# Patient Record
Sex: Female | Born: 1995 | Hispanic: Yes | Marital: Single | Smoking: Never smoker
Health system: Southern US, Community
[De-identification: ages and names within clinical notes are randomized; demographics above are authoritative.]

---

## 2020-12-03 ENCOUNTER — Emergency Department
Admission: EM | Admit: 2020-12-03 | Discharge: 2020-12-03 | Disposition: A | Discharge status: Home / Self Care | Payer: Self-pay | Attending: Emergency Medicine | Admitting: Emergency Medicine

## 2020-12-03 ENCOUNTER — Other Ambulatory Visit: Payer: Self-pay

## 2020-12-03 ENCOUNTER — Encounter: Payer: Self-pay | Admitting: Emergency Medicine

## 2020-12-03 ENCOUNTER — Emergency Department: Payer: Self-pay

## 2020-12-03 DIAGNOSIS — N83202 Unspecified ovarian cyst, left side: Secondary | ICD-10-CM

## 2020-12-03 DIAGNOSIS — N83292 Other ovarian cyst, left side: Secondary | ICD-10-CM | POA: Insufficient documentation

## 2020-12-03 DIAGNOSIS — N939 Abnormal uterine and vaginal bleeding, unspecified: Secondary | ICD-10-CM

## 2020-12-03 DIAGNOSIS — R300 Dysuria: Secondary | ICD-10-CM | POA: Insufficient documentation

## 2020-12-03 LAB — CBC
HCT: 37.6 % (ref 36.0–46.0)
Hemoglobin: 12.8 g/dL (ref 12.0–15.0)
MCH: 29.6 pg (ref 26.0–34.0)
MCHC: 34 g/dL (ref 30.0–36.0)
MCV: 87 fL (ref 80.0–100.0)
Platelets: 352 10*3/uL (ref 150–400)
RBC: 4.32 MIL/uL (ref 3.87–5.11)
RDW: 12.4 % (ref 11.5–15.5)
WBC: 7.1 10*3/uL (ref 4.0–10.5)
nRBC: 0 % (ref 0.0–0.2)

## 2020-12-03 LAB — POC URINE PREG, ED: Preg Test, Ur: NEGATIVE

## 2020-12-03 LAB — URINALYSIS, COMPLETE (UACMP) WITH MICROSCOPIC
Bacteria, UA: NONE SEEN
Bilirubin Urine: NEGATIVE
Glucose, UA: NEGATIVE mg/dL
Ketones, ur: NEGATIVE mg/dL
Leukocytes,Ua: NEGATIVE
Nitrite: NEGATIVE
Protein, ur: NEGATIVE mg/dL
Specific Gravity, Urine: 1.004 — ABNORMAL LOW (ref 1.005–1.030)
pH: 6 (ref 5.0–8.0)

## 2020-12-03 LAB — ABO/RH: ABO/RH(D): O POS

## 2020-12-03 LAB — HCG, QUANTITATIVE, PREGNANCY: hCG, Beta Chain, Quant, S: <1 m[IU]/mL (ref <5)

## 2020-12-03 MED ORDER — IBUPROFEN 600 MG PO TABS
600.0000 mg | ORAL_TABLET | Freq: Once | ORAL | Status: AC
  Administered 2020-12-03: 600 mg via ORAL
  Filled 2020-12-03: qty 1

## 2020-12-03 NOTE — ED Notes (Signed)
Back from ultrasound

## 2020-12-03 NOTE — ED Notes (Signed)
In person medical interpreter Orson Slick present at bedside along with Dr Marisa Severin. Discussed discharge education, pain management, follow up care with patient who verbalizes understanding.

## 2020-12-03 NOTE — ED Provider Notes (Signed)
[facility] Emergency Department Provider Note ____________________________________________   Event Date/Time   First MD Initiated Contact with Patient 12/03/20 1049     (approximate)  I have reviewed the triage vital signs and the nursing notes.   HISTORY  Chief Complaint Vaginal Bleeding  HPI and ROS obtained via in-person Spanish interpreter  HPI Tanya Freeman is a 25 y.o. female with LMP of 1/24 who presents with bilateral flank and lower abdominal pain since yesterday associated with a small amount of vaginal bleeding.  The patient also reports dysuria but no vaginal discharge.   She denies any vomiting or diarrhea, fever or chills, weakness, lightheadedness, or other acute symptoms.  She has not taken anything for the pain at home.  The patient has not yet sought prenatal care.  She had a positive pregnancy test at home a few weeks ago.   History reviewed. No pertinent past medical history.  There are no problems to display for this patient.   History reviewed. No pertinent surgical history.  Prior to Admission medications   Not on File    Allergies Patient has no known allergies.  No family history on file.  Social History Social History   Tobacco Use  . Smoking status: Never Smoker  . Smokeless tobacco: Never Used  Substance Use Topics  . Alcohol use: Never    Review of Systems  Constitutional: No fever/chills Eyes: No visual changes. ENT: No sore throat. Cardiovascular: Denies chest pain. Respiratory: Denies shortness of breath. Gastrointestinal: No vomiting or diarrhea.  Genitourinary: Positive for dysuria.  Musculoskeletal: Negative for back pain. Skin: Negative for rash. Neurological: Negative for headaches, focal weakness or numbness.   ____________________________________________   PHYSICAL EXAM:  VITAL SIGNS: ED Triage Vitals [12/03/20 0839]  Enc Vitals Group     BP (!) 131/93     Pulse Rate 71     Resp 20      Temp 99.5 F (37.5 C)     Temp Source Oral     SpO2 98 %     Weight 173 lb (78.5 kg)     Height 5' 6.14" (1.68 m)     Head Circumference      Peak Flow      Pain Score 8     Pain Loc      Pain Edu?      Excl. in GC?     Constitutional: Alert and oriented. Well appearing and in no acute distress. Eyes: Conjunctivae are normal.  Head: Atraumatic. Nose: No congestion/rhinnorhea. Mouth/Throat: Mucous membranes are moist.   Neck: Normal range of motion.  Cardiovascular: Normal rate, regular rhythm. Good peripheral circulation. Respiratory: Normal respiratory effort.  No retractions. Gastrointestinal: Soft with mild bilateral lower quadrant discomfort.  No focal tenderness or peritoneal signs. No distention.  Genitourinary: Normal external genitalia.  Tiny amount of blood from the cervix, no active hemorrhage.  No pooling in the vaginal vault.  No significant tenderness.  No discharge. Musculoskeletal: No lower extremity edema.  Extremities warm and well perfused.  Neurologic:  Normal speech and language. No gross focal neurologic deficits are appreciated.  Skin:  Skin is warm and dry. No rash noted. Psychiatric: Mood and affect are normal. Speech and behavior are normal.  ____________________________________________   LABS (all labs ordered are listed, but only abnormal results are displayed)  Labs Reviewed  URINALYSIS, COMPLETE (UACMP) WITH MICROSCOPIC - Abnormal; Notable for the following components:      Result Value   Color, Urine  COLORLESS (*)    APPearance CLEAR (*)    Specific Gravity, Urine 1.004 (*)    Hgb urine dipstick MODERATE (*)    All other components within normal limits  HCG, QUANTITATIVE, PREGNANCY  CBC  POC URINE PREG, ED  ABO/RH   ____________________________________________  EKG   ____________________________________________  RADIOLOGY  US pelvis: 3 cm simple left ovarian  cyst.  ____________________________________________   PROCEDURES  Procedure(s) performed: No  Procedures  Critical Care performed: No ____________________________________________   INITIAL IMPRESSION / ASSESSMENT AND PLAN / ED COURSE  Pertinent labs & imaging results that were available during my care of the patient were reviewed by me and considered in my medical decision making (see chart for details).  25 year old female with LMP of 1/24 and a positive pregnancy test at home 2 weeks ago presents with bilateral lower abdominal/pelvic pain along with a small amount of vaginal bleeding since yesterday.   On exam, the patient is overall well-appearing.  Her vital signs are normal.  The abdomen is soft with mild bilateral lower quadrant discomfort but no focal tenderness or peritoneal signs.  Pelvic exam reveals a trace amount of blood but no discharge or significant tenderness.  The initial presumption based on the patient's history was that the patient was pregnant, however her urine pregnancy test is negative.  Differential includes ovarian cyst, uterine fibroids, endometriosis, completed spontaneous AB, dysfunctional uterine bleeding, or dysmenorrhea.  There is no evidence ovarian torsion given the patient's well appearance and relatively mild level of discomfort and her reassuring exam.  There is also no evidence of PID given the lack of discharge or cervical tenderness.  We will obtain a urinalysis, ultrasound, and reassess.  ----------------------------------------- 1:28 PM on 12/03/2020 -----------------------------------------  Ultrasound shows a 3 cm left ovarian cyst.  There are no other acute abnormalities.  The urinalysis is negative as well.  On reassessment the patient states that the pain is subsiding and she has no tenderness on abdominal exam.  She has not required any pain medication in the ED although I will give her ibuprofen now.  There is no clinical evidence of  ovarian torsion.  There is no evidence of ureteral stone or other nongynecologic etiology.  At this time, the patient is stable for discharge home.  I counseled her on the results of the work-up.  She plans to follow-up at the [facility].  Return precautions given, and she expressed understanding.  Results, discharge instructions, and return precautions were discussed with the patient via an in-person Spanish interpreter.  ____________________________________________   FINAL CLINICAL IMPRESSION(S) / ED DIAGNOSES  Final diagnoses:  Cyst of left ovary      NEW MEDICATIONS STARTED DURING THIS VISIT:  New Prescriptions   No medications on file     Note:  This document was prepared using Dragon voice recognition software and may include unintentional dictation errors.   Dionne Bucy, MD 12/03/20 1330

## 2020-12-03 NOTE — ED Triage Notes (Addendum)
Patient to ER for c/o vaginal bleeding. LMP was 08/27/20, giving patient approx [redacted] weeks gestation. Patient noticed one blood clot last night, having spotting this am. Patient has not received any prenatal care at this point. States approx every 10 mins, she has pain to left lower abdomen and into left flank.

## 2020-12-03 NOTE — ED Notes (Signed)
To ultrasound via wheelchiar with ultrasound staff.

## 2020-12-22 ENCOUNTER — Other Ambulatory Visit: Payer: Self-pay

## 2020-12-22 ENCOUNTER — Encounter: Payer: Self-pay | Admitting: Emergency Medicine

## 2020-12-22 ENCOUNTER — Emergency Department
Admission: EM | Admit: 2020-12-22 | Discharge: 2020-12-22 | Disposition: A | Discharge status: Home / Self Care | Payer: Self-pay | Attending: Emergency Medicine | Admitting: Emergency Medicine

## 2020-12-22 DIAGNOSIS — N83202 Unspecified ovarian cyst, left side: Secondary | ICD-10-CM | POA: Insufficient documentation

## 2020-12-22 DIAGNOSIS — N939 Abnormal uterine and vaginal bleeding, unspecified: Secondary | ICD-10-CM | POA: Insufficient documentation

## 2020-12-22 LAB — COMPREHENSIVE METABOLIC PANEL
ALT: 25 U/L (ref 0–44)
AST: 21 U/L (ref 15–41)
Albumin: 4 g/dL (ref 3.5–5.0)
Alkaline Phosphatase: 74 U/L (ref 38–126)
Anion gap: 6 (ref 5–15)
BUN: 10 mg/dL (ref 6–20)
CO2: 23 mmol/L (ref 22–32)
Calcium: 8.8 mg/dL — ABNORMAL LOW (ref 8.9–10.3)
Chloride: 109 mmol/L (ref 98–111)
Creatinine, Ser: 0.58 mg/dL (ref 0.44–1.00)
GFR, Estimated: >60 mL/min (ref >60)
Glucose, Bld: 99 mg/dL (ref 70–99)
Potassium: 4 mmol/L (ref 3.5–5.1)
Sodium: 138 mmol/L (ref 135–145)
Total Bilirubin: 0.3 mg/dL (ref 0.3–1.2)
Total Protein: 7.5 g/dL (ref 6.5–8.1)

## 2020-12-22 LAB — URINALYSIS, COMPLETE (UACMP) WITH MICROSCOPIC
Bilirubin Urine: NEGATIVE
Glucose, UA: NEGATIVE mg/dL
Ketones, ur: NEGATIVE mg/dL
Leukocytes,Ua: NEGATIVE
Nitrite: NEGATIVE
Protein, ur: 100 mg/dL — AB
RBC / HPF: >50 RBC/hpf — ABNORMAL HIGH (ref 0–5)
Specific Gravity, Urine: 1.018 (ref 1.005–1.030)
pH: 6 (ref 5.0–8.0)

## 2020-12-22 LAB — CBC WITH DIFFERENTIAL/PLATELET
Abs Immature Granulocytes: 0.04 10*3/uL (ref 0.00–0.07)
Basophils Absolute: 0.1 10*3/uL (ref 0.0–0.1)
Basophils Relative: 1 %
Eosinophils Absolute: 0.1 10*3/uL (ref 0.0–0.5)
Eosinophils Relative: 1 %
HCT: 36.4 % (ref 36.0–46.0)
Hemoglobin: 12.4 g/dL (ref 12.0–15.0)
Immature Granulocytes: 0 %
Lymphocytes Relative: 20 %
Lymphs Abs: 2.1 10*3/uL (ref 0.7–4.0)
MCH: 29.7 pg (ref 26.0–34.0)
MCHC: 34.1 g/dL (ref 30.0–36.0)
MCV: 87.3 fL (ref 80.0–100.0)
Monocytes Absolute: 0.6 10*3/uL (ref 0.1–1.0)
Monocytes Relative: 5 %
Neutro Abs: 7.9 10*3/uL — ABNORMAL HIGH (ref 1.7–7.7)
Neutrophils Relative %: 73 %
Platelets: 361 10*3/uL (ref 150–400)
RBC: 4.17 MIL/uL (ref 3.87–5.11)
RDW: 12.6 % (ref 11.5–15.5)
WBC: 10.7 10*3/uL — ABNORMAL HIGH (ref 4.0–10.5)
nRBC: 0 % (ref 0.0–0.2)

## 2020-12-22 LAB — POC URINE PREG, ED: Preg Test, Ur: NEGATIVE

## 2020-12-22 MED ORDER — KETOROLAC TROMETHAMINE 30 MG/ML IJ SOLN
30.0000 mg | Freq: Once | INTRAMUSCULAR | Status: AC
  Administered 2020-12-22: 30 mg via INTRAMUSCULAR
  Filled 2020-12-22: qty 1

## 2020-12-22 MED ORDER — ACETAMINOPHEN 500 MG PO TABS
1000.0000 mg | ORAL_TABLET | Freq: Once | ORAL | Status: AC
  Administered 2020-12-22: 1000 mg via ORAL
  Filled 2020-12-22: qty 2

## 2020-12-22 NOTE — ED Notes (Signed)
ED Provider at bedside. 

## 2020-12-22 NOTE — Discharge Instructions (Signed)
Use naproxen/Aleve for anti-inflammatory pain relief. Use up to 500mg  every 12 hours. Do not take more frequently than this. Do not use other NSAIDs (ibuprofen, Advil) while taking this medication. It is safe to take Tylenol with this.   Return to the ED with any fevers, severely worsening pain, passing out

## 2020-12-22 NOTE — ED Provider Notes (Signed)
[facility] Emergency Department Provider Note ____________________________________________   Event Date/Time   First MD Initiated Contact with Patient 12/22/20 [phone]     (approximate)  I have reviewed the triage vital signs and the nursing notes.  HISTORY  Chief Complaint Vaginal Bleeding   HPI Tanya Freeman is a 25 y.o. femalewho presents to the ED for evaluation of vaginal bleeding.   Chart review indicates patient was here on 5/2 for the same.  Pelvic ultrasound demonstrated left-sided ovarian cyst, 3 cm. Patient reports not following up with OB/GYN.  She reports using ibuprofen intermittently with some improvement of her pain.  Patient presents to the ED today for continued bleeding and pain to her LLQ abdomen.  She reports it feels similar as it has over the past 3 weeks, but has not resolved.  Denies acute worsening of the pain, fever, upper abdominal pain, emesis, dysuria or stool changes.  Last took ibuprofen yesterday.  Spanish interpreter utilized for history and physical  History reviewed. No pertinent past medical history.  There are no problems to display for this patient.   History reviewed. No pertinent surgical history.  Prior to Admission medications   Not on File    Allergies Patient has no known allergies.  History reviewed. No pertinent family history.  Social History Social History   Tobacco Use  . Smoking status: Never Smoker  . Smokeless tobacco: Never Used  Substance Use Topics  . Alcohol use: Never    Review of Systems  Constitutional: No fever/chills Eyes: No visual changes. ENT: No sore throat. Cardiovascular: Denies chest pain. Respiratory: Denies shortness of breath. Gastrointestinal: Positive for abdominal pain  No nausea, no vomiting.  No diarrhea.  No constipation. Genitourinary: Negative for dysuria.  Positive for vaginal bleeding Musculoskeletal: Negative for back pain. Skin: Negative for  rash. Neurological: Negative for headaches, focal weakness or numbness.  ____________________________________________   PHYSICAL EXAM:  VITAL SIGNS: Vitals:   12/22/20 0845 12/22/20 0930  BP: 129/75 110/66  Pulse: 75 63  Resp: 20 16  Temp: 98.4 F (36.9 C)   SpO2: 100% 99%     Constitutional: Alert and oriented. Well appearing and in no acute distress. Eyes: Conjunctivae are normal. PERRL. EOMI. Head: Atraumatic. Nose: No congestion/rhinnorhea. Mouth/Throat: Mucous membranes are moist.  Oropharynx non-erythematous. Neck: No stridor. No cervical spine tenderness to palpation. Cardiovascular: Normal rate, regular rhythm. Grossly normal heart sounds.  Good peripheral circulation. Respiratory: Normal respiratory effort.  No retractions. Lungs CTAB. Gastrointestinal: Soft , nondistended. No CVA tenderness. Mild LLQ tenderness without peritoneal features.  Abdomen is otherwise benign. Musculoskeletal: No lower extremity tenderness nor edema.  No joint effusions. No signs of acute trauma. Neurologic:  Normal speech and language. No gross focal neurologic deficits are appreciated. No gait instability noted. Skin:  Skin is warm, dry and intact. No rash noted. Psychiatric: Mood and affect are normal. Speech and behavior are normal.  ____________________________________________   LABS (all labs ordered are listed, but only abnormal results are displayed)  Labs Reviewed  CBC WITH DIFFERENTIAL/PLATELET - Abnormal; Notable for the following components:      Result Value   WBC 10.7 (*)    Neutro Abs 7.9 (*)    All other components within normal limits  COMPREHENSIVE METABOLIC PANEL - Abnormal; Notable for the following components:   Calcium 8.8 (*)    All other components within normal limits  URINALYSIS, COMPLETE (UACMP) WITH MICROSCOPIC - Abnormal; Notable for the following components:   Color, Urine  AMBER (*)    APPearance HAZY (*)    Hgb urine dipstick LARGE (*)    Protein,  ur 100 (*)    RBC / HPF >50 (*)    Bacteria, UA RARE (*)    All other components within normal limits  POC URINE PREG, ED   ____________________________________________  12 Lead EKG   ____________________________________________  RADIOLOGY  ED MD interpretation:    Official radiology report(s): No results found.  ____________________________________________   PROCEDURES and INTERVENTIONS  Procedure(s) performed (including Critical Care):  Procedures  Medications  ketorolac (TORADOL) 30 MG/ML injection 30 mg (30 mg Intramuscular Given 12/22/20 0909)  acetaminophen (TYLENOL) tablet 1,000 mg (1,000 mg Oral Given 12/22/20 0909)    ____________________________________________   MDM / ED COURSE   25 year old woman with known simple left ovarian cyst presents to the ED with continued subacute vaginal bleeding, without evidence of additional acute pathology, and amenable to outpatient management.  Normal vitals.  Exam is reassuring with mild LLQ tenderness without peritoneal features and otherwise well-appearing patient.  Blood work shows stable hemoglobin without evidence of blood loss anemia.  No worsening of her pain or clinical changes to her symptoms to suggest ovarian torsion and she has resolution of symptoms after Toradol, therefore repeat pelvic ultrasound was not performed.  No stool changes to suggest diverticulitis.  Discussed the importance of following up with OB/GYN with the patient and we discussed return precautions for the ED prior to discharge.  Clinical Course as of 12/22/20 1028  Sat Dec 22, 2020  0902 Discussed the patient plan to perform repeat blood work, empiric nonnarcotic analgesia.  We discussed indication for repeat imaging at this time.  We discussed reassessment and possible imaging thereafter. [DS]  1028 Reassessed.  Patient reports improved symptoms.  Reexamination reveals benign exam of her abdomen.  We discussed return precautions for the ED and  following up with OB/GYN.  She is agreeable. [DS]    Clinical Course User Index [DS] Delton Prairie, MD    ____________________________________________   FINAL CLINICAL IMPRESSION(S) / ED DIAGNOSES  Final diagnoses:  Vaginal bleeding  Left ovarian cyst     ED Discharge Orders    None       Tanya Freeman   Note:  This document was prepared using Dragon voice recognition software and may include unintentional dictation errors.   Delton Prairie, MD 12/22/20 1030

## 2020-12-22 NOTE — ED Triage Notes (Signed)
Pt via POV from home. Pt c/o vaginal bleeding since 5/2. Pt states they told her she possibly had a miscarriage. Blood is bright and dark per pt. Pt c/o LLQ abd pain. Denies NVD. Pt has not followed up with a OB since her visit on the second.   Interpreter services used

## 2021-03-22 ENCOUNTER — Other Ambulatory Visit: Payer: Self-pay

## 2021-03-22 ENCOUNTER — Emergency Department
Admission: EM | Admit: 2021-03-22 | Discharge: 2021-03-22 | Disposition: A | Discharge status: Home / Self Care | Payer: Self-pay | Attending: Emergency Medicine | Admitting: Emergency Medicine

## 2021-03-22 DIAGNOSIS — R509 Fever, unspecified: Secondary | ICD-10-CM | POA: Insufficient documentation

## 2021-03-22 DIAGNOSIS — R519 Headache, unspecified: Secondary | ICD-10-CM | POA: Insufficient documentation

## 2021-03-22 DIAGNOSIS — O26891 Other specified pregnancy related conditions, first trimester: Secondary | ICD-10-CM | POA: Insufficient documentation

## 2021-03-22 DIAGNOSIS — Z3A08 8 weeks gestation of pregnancy: Secondary | ICD-10-CM | POA: Insufficient documentation

## 2021-03-22 DIAGNOSIS — Z5321 Procedure and treatment not carried out due to patient leaving prior to being seen by health care provider: Secondary | ICD-10-CM | POA: Insufficient documentation

## 2021-03-22 NOTE — ED Triage Notes (Signed)
Pt comes with c/o fever chills aches and headaches. Pt taken tylenol with little relief.  Pt is [redacted] weeks pregnant with no issues.

## 2021-09-12 ENCOUNTER — Observation Stay
Admission: EM | Admit: 2021-09-12 | Discharge: 2021-09-12 | Disposition: A | Discharge status: Home / Self Care | Payer: Self-pay | Attending: Obstetrics and Gynecology | Admitting: Obstetrics and Gynecology

## 2021-09-12 DIAGNOSIS — O26893 Other specified pregnancy related conditions, third trimester: Principal | ICD-10-CM | POA: Insufficient documentation

## 2021-09-12 DIAGNOSIS — R1030 Lower abdominal pain, unspecified: Secondary | ICD-10-CM | POA: Insufficient documentation

## 2021-09-12 DIAGNOSIS — Z3A35 35 weeks gestation of pregnancy: Secondary | ICD-10-CM | POA: Insufficient documentation

## 2021-09-12 DIAGNOSIS — O4703 False labor before 37 completed weeks of gestation, third trimester: Secondary | ICD-10-CM | POA: Diagnosis present

## 2021-09-12 LAB — WET PREP, GENITAL
Clue Cells Wet Prep HPF POC: NONE SEEN
Sperm: NONE SEEN
Trich, Wet Prep: NONE SEEN
WBC, Wet Prep HPF POC: <10 (ref <10)
Yeast Wet Prep HPF POC: NONE SEEN

## 2021-09-12 LAB — URINALYSIS, ROUTINE W REFLEX MICROSCOPIC
Bilirubin Urine: NEGATIVE
Glucose, UA: NEGATIVE mg/dL
Hgb urine dipstick: NEGATIVE
Ketones, ur: 5 mg/dL — AB
Leukocytes,Ua: NEGATIVE
Nitrite: NEGATIVE
Protein, ur: NEGATIVE mg/dL
Specific Gravity, Urine: 1.003 — ABNORMAL LOW (ref 1.005–1.030)
pH: 7 (ref 5.0–8.0)

## 2021-09-12 LAB — CHLAMYDIA/NGC RT PCR (ARMC ONLY)
Chlamydia Tr: NOT DETECTED
N gonorrhoeae: NOT DETECTED

## 2021-09-12 MED ORDER — ACETAMINOPHEN 325 MG PO TABS
ORAL_TABLET | ORAL | Status: AC
  Filled 2021-09-12: qty 2

## 2021-09-12 MED ORDER — ZOLPIDEM TARTRATE 5 MG PO TABS
5.0000 mg | ORAL_TABLET | Freq: Every evening | ORAL | Status: DC | PRN
  Administered 2021-09-12: 5 mg via ORAL
  Filled 2021-09-12: qty 1

## 2021-09-12 MED ORDER — NIFEDIPINE ER OSMOTIC RELEASE 30 MG PO TB24
30.0000 mg | ORAL_TABLET | Freq: Once | ORAL | Status: AC
  Administered 2021-09-12: 30 mg via ORAL
  Filled 2021-09-12: qty 1

## 2021-09-12 NOTE — Progress Notes (Signed)
[employer] [id number]

## 2021-09-12 NOTE — OB Triage Note (Signed)
Pt reports to unit c/o LOF and vaginal pressure and lower abdominal pain that is constant. Pt reports +FM, denies vaginal bleeding. External monitors applied and assessing. Initial FHT 125.

## 2021-09-16 NOTE — Discharge Summary (Signed)
° ° °  L&D OB Triage Note  SUBJECTIVE Tanya Freeman is a 26 y.o. [id number] female at [redacted]w[redacted]d, EDD Estimated Date of Delivery: 10/19/21 who presented to triage with complaints of lower abdominal pressure. Denies other problems.  OB History  Gravida Para Term Preterm AB Living  3 2 2  0 0 2  SAB IAB Ectopic Multiple Live Births  0 0 0 0 0    # Outcome Date GA Lbr Len/2nd Weight Sex Delivery Anes PTL Lv  3 Current           2 Term 2017     Vag-Spont     1 Term 2011     Vag-Spont       No medications prior to admission.     OBJECTIVE  Nursing Evaluation:   BP (!) 107/56 (BP Location: Left Arm)    Pulse 92    Temp 98.8 F (37.1 C) (Oral)    Resp 18    Ht 5' 4.17" (1.63 m)    Wt 73.9 kg    BMI 27.83 kg/m    Findings:        Not in labor - irregular contractions      NST was performed and has been reviewed by me.  NST INTERPRETATION: Category I  Mode: External Baseline Rate (A): 130 bpm Variability: Moderate Accelerations: 15 x 15 Decelerations: None     Contraction Frequency (min): 4-6  ASSESSMENT Impression:  1.  Pregnancy:  2012 at [redacted]w[redacted]d , EDD Estimated Date of Delivery: 10/19/21 2.  Reassuring fetal and maternal status   PLAN 1. Current condition and above findings reviewed.  Reassuring fetal and maternal condition. 2. Discharge home with standard labor precautions given to return to L&D or call the office for problems. 3. Continue routine prenatal care.

## 2023-01-18 IMAGING — US US PELVIS COMPLETE WITH TRANSVAGINAL
1 series · 13 of 25 positions shown · non-contrast
Comparison: None

CLINICAL DATA: Left lower quadrant pain x1 day, vaginal bleeding

EXAM:
TRANSABDOMINAL AND TRANSVAGINAL ULTRASOUND OF PELVIS
TECHNIQUE: Both transabdominal and transvaginal ultrasound examinations of the
pelvis were performed. Transabdominal technique was performed for
global imaging of the pelvis including uterus, ovaries, adnexal
regions, and pelvic cul-de-sac. It was necessary to proceed with
endovaginal exam following the transabdominal exam to visualize the
endometrium and bilateral ovaries.

[Series 1: us pelvic complete with transvaginal · 13 of 94 slices shown]
[im 1/94]
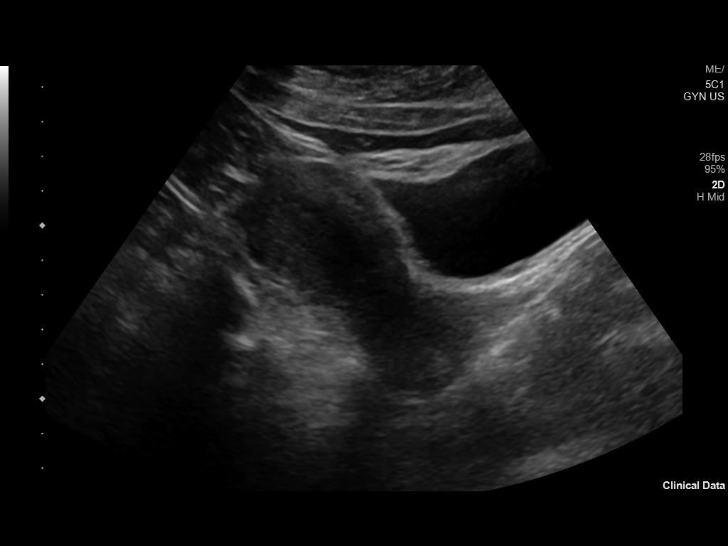
[im 8/94]
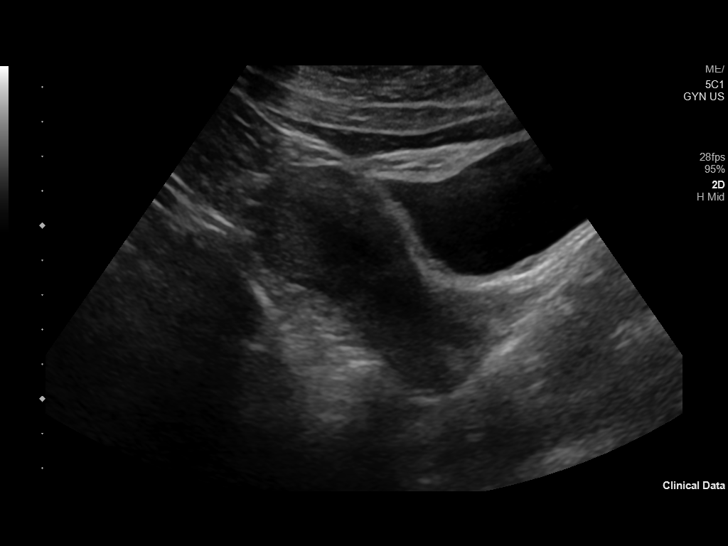
[im 16/94]
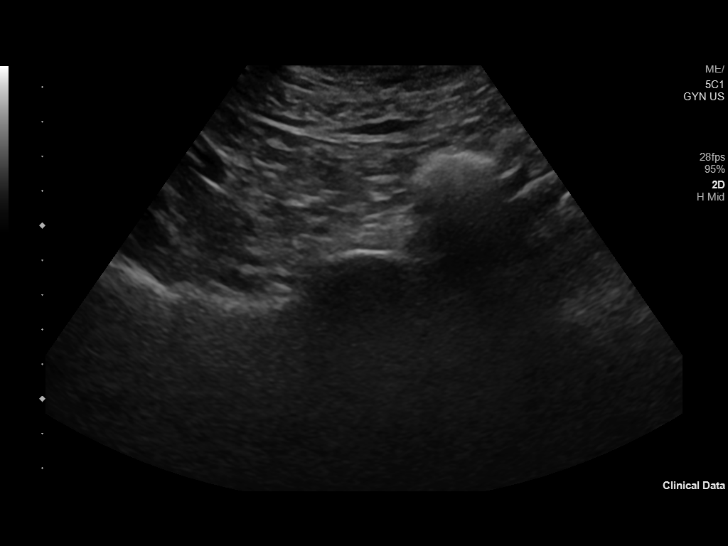
[im 24/94]
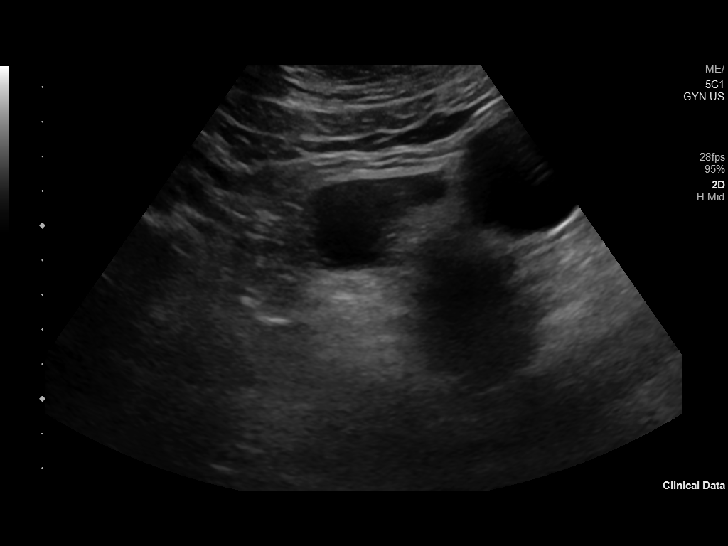
[im 32/94]
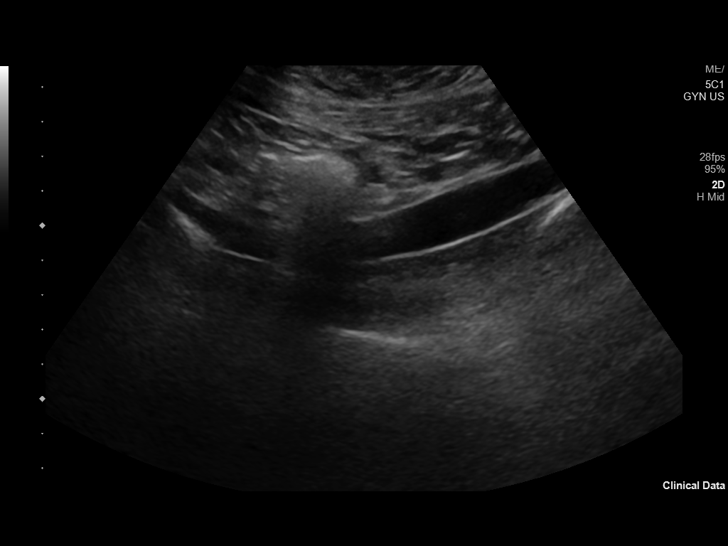
[im 39/94]
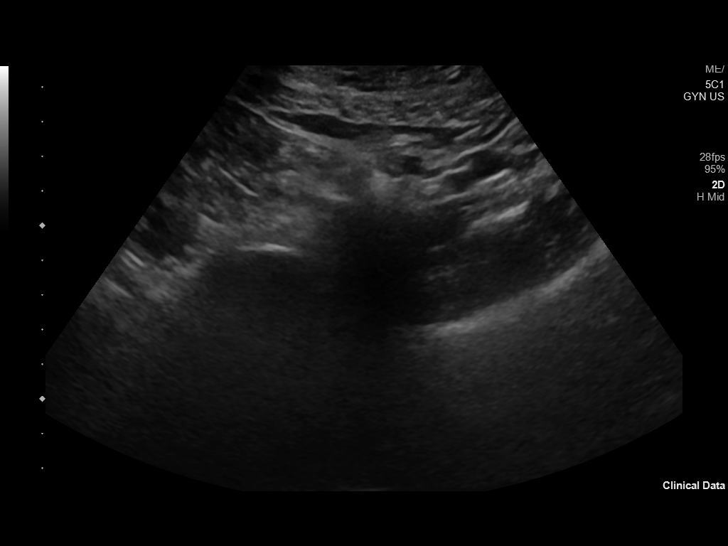
[im 47/94]
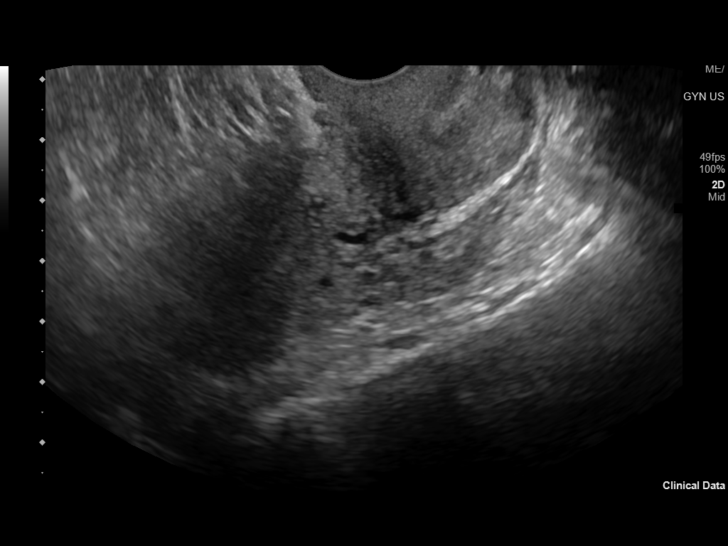
[im 55/94]
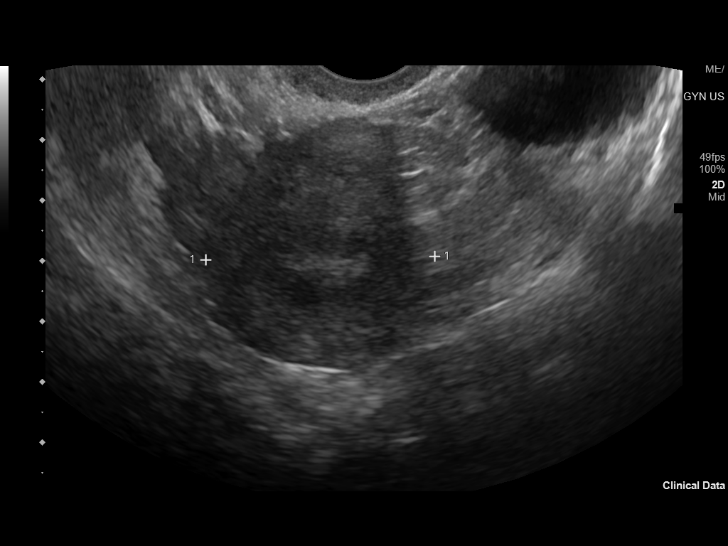
[im 63/94]
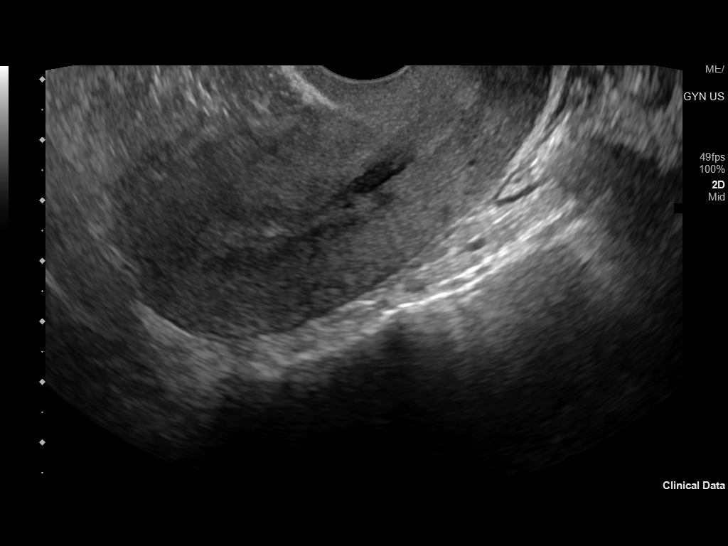
[im 70/94]
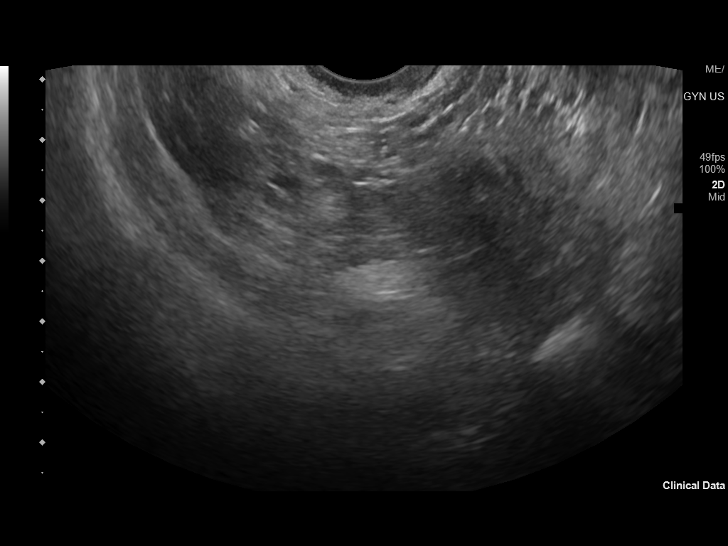
[im 78/94]
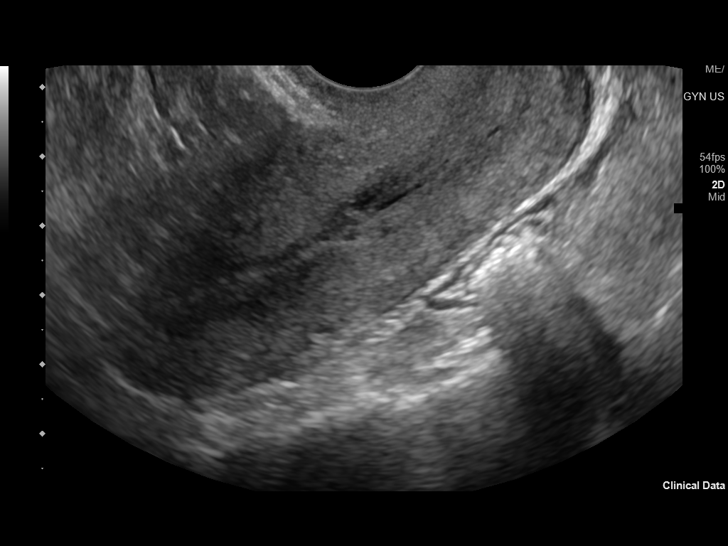
[im 86/94]
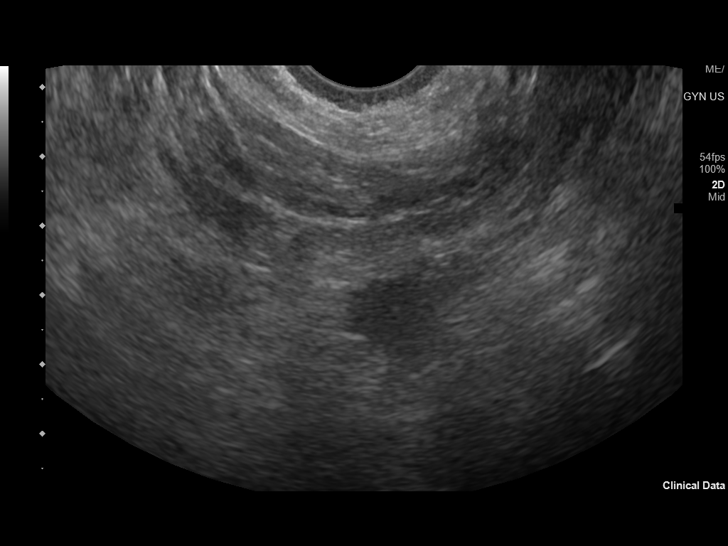
[im 94/94]
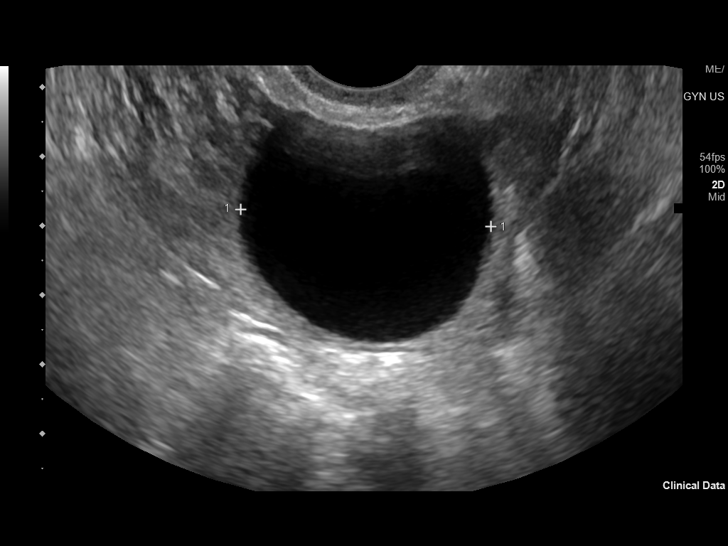

[13 of 25 positions shown; findings below may reference images not displayed]

FINDINGS: Uterus

Measurements: 6.5 x 3.4 x 3.8 cm = volume: 44 mL. No fibroids or
other mass visualized.

Endometrium

Thickness: 4 mm.  No focal abnormality visualized.

Right ovary

Measurements: 1.9 x 1.2 x 1.5 cm = volume: 1.8 mL. Normal
appearance/no adnexal mass.

Left ovary

Measurements: 4.4 x 3.5 x 4.2 cm = volume: 35 mL. 3.0 x 3.5 x 3.6 cm
simple cyst/follicle, physiologic.

Other findings

No abnormal free fluid.
IMPRESSION: 3.6 cm simple left ovarian cyst/follicle, physiologic. No follow up
imaging recommended. Note: This recommendation does not apply to
premenarchal patients or to those with increased risk (genetic,
family history, elevated tumor markers or other high-risk factors)
of ovarian cancer. Reference: Radiology [DATE]):[id number].
